# Patient Record
Sex: Male | Born: 1995 | Race: White | Hispanic: No | Marital: Single | Smoking: Never smoker
Health system: Southern US, Community
[De-identification: ages and names within clinical notes are randomized; demographics above are authoritative.]

## PROBLEM LIST (undated history)

## (undated) DIAGNOSIS — F909 Attention-deficit hyperactivity disorder, unspecified type: Secondary | ICD-10-CM

## (undated) DIAGNOSIS — J302 Other seasonal allergic rhinitis: Secondary | ICD-10-CM

## (undated) DIAGNOSIS — F913 Oppositional defiant disorder: Secondary | ICD-10-CM

## (undated) HISTORY — PX: NO PAST SURGERIES: SHX2092

## (undated) HISTORY — DX: Other seasonal allergic rhinitis: J30.2

## (undated) HISTORY — DX: Oppositional defiant disorder: F91.3

## (undated) HISTORY — DX: Attention-deficit hyperactivity disorder, unspecified type: F90.9

---
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
## 2015-01-16 ENCOUNTER — Ambulatory Visit (INDEPENDENT_AMBULATORY_CARE_PROVIDER_SITE_OTHER): Payer: 59 | Admitting: Sports Medicine

## 2015-01-16 ENCOUNTER — Encounter: Payer: Self-pay | Admitting: Sports Medicine

## 2015-01-16 VITALS — BP 121/74 | HR 94 | Ht 71.0 in | Wt 160.0 lb

## 2015-01-16 DIAGNOSIS — M25571 Pain in right ankle and joints of right foot: Secondary | ICD-10-CM

## 2015-01-16 NOTE — Progress Notes (Signed)
  Braysen Cloward - 19 y.o. male MRN [id number]  Date of birth: Aug 31, 1995 Ekin Pilar is a 19 y.o. male who presents today for acute onset swelling and pain in the dorsum of his right foot. His pain started Tuesday night while playing volleyball. He noticed gradual onset of pain that started for several days prior to this. After coming down from a jump the foot was very painful. He had difficulty walking. He had significant swelling that night. The next day the swelling went down some and he can walk with a limp. Today he says the swelling is much less and he can walk with only mild pain. Currently the pain is 1/10. He was playing in [employer] free tennis shoes. These show significant wear and tear on the forefoot  Past history-no significant injuries or surgeries  Review of systems no significant history of joint pain or swelling/ no history of rashes/no history of fever/no history of gout No numbness tingling or sciatic symptoms in the right leg    Exam:  Filed Vitals:   01/16/15 1428  BP: 121/74  Pulse: 94   Gen: NAD Cardiorespiratory - Normal respiratory effort/rate.  RRR  Ankle: No visible erythema or swelling. Range of motion is full in all directions. Strength is 5/5 in all directions. Stable lateral and medial ligaments; squeeze test and kleiger test unremarkable; Talar dome nontender; No pain at base of 5th MT; No tenderness over cuboid; No tenderness over N spot or navicular prominence No tenderness on posterior aspects of lateral and medial malleolus No sign of peroneal tendon subluxations; Negative tarsal tunnel tinel's Able to walk 4 steps.  Foot shape is cavus  Tenderness to palpation is directly over the second tarsometatarsal joint  There is moderate swelling over this joint directly over the area of maximal tenderness  Walking gait is now slightly antalgic  Imaging with ultrasound Small effusion over the second TMT joint There is a minor bony  irregularity of the cuneiform side of the joint Increased Doppler flow over this area

## 2015-01-17 DIAGNOSIS — M25579 Pain in unspecified ankle and joints of unspecified foot: Secondary | ICD-10-CM | POA: Insufficient documentation

## 2015-01-17 NOTE — Assessment & Plan Note (Signed)
We placed him in a postop shoe Arch strap placed No weight bearing activity for 2 weeks except regular walking OK to cross train He needs to purchase shoes with better support  We should repeat his scan in 2 weeks and significantly the amount of weightbearing activity  I am concerned that his history of oppositional defiant disorder may make him noncompliant

## 2015-01-30 ENCOUNTER — Ambulatory Visit (INDEPENDENT_AMBULATORY_CARE_PROVIDER_SITE_OTHER): Payer: 59 | Admitting: Sports Medicine

## 2015-01-30 ENCOUNTER — Encounter: Payer: Self-pay | Admitting: Sports Medicine

## 2015-01-30 VITALS — BP 127/82 | HR 118 | Ht 71.0 in | Wt 160.0 lb

## 2015-01-30 DIAGNOSIS — M25571 Pain in right ankle and joints of right foot: Secondary | ICD-10-CM

## 2015-01-30 NOTE — Assessment & Plan Note (Signed)
This is healing rapidly and clinically he is very functional  Return to normal shoe I added arch support Use arch strap x 6 weeks total  Progress to full activity over next 4 weeks  RTC if inc pain or swelling over injury

## 2015-01-30 NOTE — Progress Notes (Signed)
  Subjective:    Gabriel Moreno is a 19 y.o. old male here with his father for No chief complaint on file. .   CC: Follow up of small avulsion fx at TMT 2 of RT foot  HPI  Gabriel Moreno has complied well with wearing his boot in the interim since last visit. 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 He has also been regularly wearing his compression sleeve and spent 5-7 days icing his foot and continues to elevate his leg frequently. In the interim, he has refrained from high impact activity including running or jumping. He notes that his pain and swelling have improved markedly over the past 2 weeks. He still gets mild swelling usually towards the end of the day but has no pain at night or when he wakes in the morning.  Now no pain with usual walking  Review of Systems no other joint swelling/ no redness/ no arch pain in left foot  History and Problem List: Gabriel Moreno has ADHD (attention deficit hyperactivity disorder), combined type; ODD (oppositional defiant disorder); Dysthymic disorder; and Pain in joint, ankle and foot on his problem list.  [name]  has a past medical history of ADHD (attention deficit hyperactivity disorder); Oppositional defiant disorder; and Seasonal allergies.     Objective:    BP 127/82 mmHg  Pulse 118  Ht 5\' 11"  (1.803 m)  Wt 160 lb (72.576 kg)  BMI 22.33 kg/m2 Physical Exam  Athletic white male in no acute distress  Dark ecchymosis on dorsolateral aspect of foot under compression wrap No noted edema over dorsum of right foot Mild tenderness over dorsum of right 2nd tarsometarsal joint, no distal metatarsal tenderness 5/5 strength on foot dorsiflexion, plantarflexion, inversion, and eversion, toe extension and flexion Normal gait without evidence of antalgia Tolerates light jog without pain Tolerates 3-5 one-foot hops without pain      Assessment and Plan:     Gabriel Moreno  was seen today for follow-up of right proximal stress reaction/fracture at base of 2nd metatarsal. His symptoms have improved markedly with compliance with the boot and compression sleeve.  1. Pain in joint, ankle and foot, right - continue compression sleeve for 4-6 more weeks - arch support provided for new shoes - can ambulate with shoes and discontinue use of the boot - can do walking, light jogging and low impact exercise for next week - if after another week, symptoms remain improved, can start to add in light jumping and advance activity level as pain tolerates - suspect injury will be 90% healed in one more week and 100% healed in 4 more weeks  Charlie Pitter, Annalee Genta, MD

## 2015-02-24 ENCOUNTER — Ambulatory Visit (HOSPITAL_COMMUNITY): Payer: Self-pay | Admitting: Psychiatry

## 2015-04-24 ENCOUNTER — Encounter (HOSPITAL_COMMUNITY): Payer: Self-pay | Admitting: Psychiatry

## 2015-04-24 ENCOUNTER — Ambulatory Visit (INDEPENDENT_AMBULATORY_CARE_PROVIDER_SITE_OTHER): Payer: 59 | Admitting: Psychiatry

## 2015-04-24 VITALS — BP 104/63 | HR 85 | Ht 70.75 in | Wt 158.4 lb

## 2015-04-24 DIAGNOSIS — F341 Dysthymic disorder: Secondary | ICD-10-CM | POA: Diagnosis not present

## 2015-04-24 DIAGNOSIS — F900 Attention-deficit hyperactivity disorder, predominantly inattentive type: Secondary | ICD-10-CM

## 2015-04-24 DIAGNOSIS — F902 Attention-deficit hyperactivity disorder, combined type: Secondary | ICD-10-CM

## 2015-04-24 DIAGNOSIS — F913 Oppositional defiant disorder: Secondary | ICD-10-CM | POA: Diagnosis not present

## 2015-04-24 MED ORDER — AMPHETAMINE-DEXTROAMPHETAMINE 30 MG PO TABS
30.0000 mg | ORAL_TABLET | Freq: Every day | ORAL | Status: DC
Start: 2015-04-24 — End: 2015-11-17

## 2015-04-24 NOTE — Progress Notes (Signed)
    [facility] Follow-up Outpatient Visit  Gabriel Moreno January 30, 1996     Subjective:-  I'm doing well  History of present illness-patient seen for the first time by Dr. Salem Senate, he is a transfer from Dr. Dwyane Dee service  patient is a 20 year old white male with a previous diagnosis of ADHD combined type and ODD presently on Adderall 30 mg every morning seen today for the first time for follow-up. Patient was accompanied by his father and with patient's permission he sat through the session.  Patient states that he is doing well his last semester was very good and he looking forward to starting the semester again. States that his sleep is good, appetite is good mood has been stable and bright. No suicidal or homicidal ideation no hallucinations or delusions.  Patient lives at home with his parents. Overall has been coping well and had a good Christmas. His tolerating his medications well and coping well.     Past Medical History  Diagnosis Date  . ADHD (attention deficit hyperactivity disorder)   . Oppositional defiant disorder   . 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 Patient also works at [employer]: Positive for congestion. Negative for ear discharge, ear pain, hearing loss and tinnitus.   Respiratory: Negative for stridor.   Neurological: Negative for headaches.  Psychiatric/Behavioral: Positive for depression. The patient is nervous/anxious.    Current Outpatient Prescriptions on File Prior to Visit  Medication Sig Dispense Refill  . amphetamine-dextroamphetamine (ADDERALL) 30 MG tablet Take 1 tablet by mouth daily with breakfast. 90 tablet 0  . fluticasone (FLONASE) 50 MCG/ACT nasal spray Place 1 spray into both nostrils daily.    Marland Kitchen  ibuprofen (ADVIL,MOTRIN) 200 MG tablet Take 200 mg by mouth every 6 (six) hours as needed.    . loratadine (CLARITIN) 10 MG tablet Take 10 mg by mouth daily.     No current facility-administered medications on file prior to visit.     General Appearance: alert, oriented, no acute distress and well nourished Blood pressure 104/63, pulse 85, height 5' 10.75" (1.797 m), weight 158 lb 6.4 oz (71.85 kg). Musculoskeletal: Strength & Muscle Tone: within normal limits Gait & Station: normal Patient leans: N/A Mental Status Examination  Appearance: Casually dressed Alert: Yes Attention: fair  Cooperative: Yes Eye Contact: Good Speech: Normal in volume, rate, tone, spontaneous  Psychomotor Activity: Normal Memory/Concentration: So, So, recent and remote memories are intact Oriented: person, place and situation Mood: Euthymic Affect: Congruent and Full Range Thought Processes and Associations: Goal Directed and Intact Fund of Knowledge: Fair Thought Content: Suicidal ideation, Homicidal ideation, Auditory hallucinations, Visual hallucinations, Delusions and Paranoia, none reported Insight: Fair  Judgement: Fair  Language: Fair  suicidal ideation none.  Homicidal ideation none.  Diagnosis: Dysthymic Disorder, ADHD Combined type, Oppositional Defiant Disorder Treatment Plan:  Continue Adderall 30 mrd 1 in the morning. The medication is for ADHD, inattentive type Take melatonin when necessary as needed for sleep.  Call when necessary Follow-up in 6 months This visit was of low medical complexity.. Did discuss communication with family in length with patient at this visit as he struggles in his relationship with his parents  Erin Sons, MD

## 2015-10-22 ENCOUNTER — Ambulatory Visit (HOSPITAL_COMMUNITY): Payer: Self-pay | Admitting: Psychiatry

## 2015-11-17 ENCOUNTER — Ambulatory Visit (INDEPENDENT_AMBULATORY_CARE_PROVIDER_SITE_OTHER): Payer: 59 | Admitting: Sports Medicine

## 2015-11-17 ENCOUNTER — Encounter: Payer: Self-pay | Admitting: Sports Medicine

## 2015-11-17 ENCOUNTER — Ambulatory Visit
Admission: RE | Admit: 2015-11-17 | Discharge: 2015-11-17 | Disposition: A | Discharge status: Home / Self Care | Payer: 59 | Source: Ambulatory Visit | Attending: Sports Medicine | Admitting: Sports Medicine

## 2015-11-17 VITALS — BP 122/64 | HR 63 | Ht 71.0 in | Wt 165.0 lb

## 2015-11-17 DIAGNOSIS — M25532 Pain in left wrist: Secondary | ICD-10-CM | POA: Diagnosis not present

## 2015-11-17 DIAGNOSIS — S6992XA Unspecified injury of left wrist, hand and finger(s), initial encounter: Secondary | ICD-10-CM | POA: Diagnosis not present

## 2015-11-17 MED ORDER — MELOXICAM 15 MG PO TABS: 15.0000 mg | ORAL_TABLET | Freq: Every day | ORAL | 0 refills | Status: AC

## 2015-11-17 MED FILL — MELOXICAM 15 MG TABLET: 15 | 30 days supply | Qty: 30 | Fill #0

## 2015-11-17 NOTE — Progress Notes (Signed)
   Subjective:    Patient ID: Kaeson Eary , male   DOB: [birth year] , 20 y.o..   MRN: [id number]  HPI  Domnique Schettler is here for left wrist pain.  He sustained a left hand/wrist injury ~3 months ago in May. Mechanism of injury: He was playing volleyball and landed on his left hand (hand did not seem to be outstretched). Immediate symptoms: immediate pain, was able to use hand directly after injury. No swelling or bruising was noted. Symptoms have been constant since that time but is more "dull" now. The pain is worse especially when he plays volleyball and with flexion and extension. He continues to play volleyball about 5 x a week . Prior history of related problems: no prior problems with this area in the past. Denies numbness or tingling.    Review of Systems: Per HPI. All other systems reviewed and are negative.  Past Medical History: Patient Active Problem List   Diagnosis Date Noted  . Pain in joint, ankle and foot 01/17/2015  . ADHD (attention deficit hyperactivity disorder), combined type 01/25/2012  . ODD (oppositional defiant disorder) 01/25/2012  . Dysthymic disorder 01/25/2012    Medications: Current Outpatient Prescriptions  Medication Sig Dispense Refill  . fluticasone (FLONASE) 50 MCG/ACT nasal spray Place 1 spray into both nostrils daily.    Marland Kitchen ibuprofen (ADVIL,MOTRIN) 200 MG tablet Take 200 mg by mouth every 6 (six) hours as needed.    . loratadine (CLARITIN) 10 MG tablet Take 10 mg by mouth daily.    . meloxicam (MOBIC) 15 MG tablet Take 1 tablet (15 mg total) by mouth daily. 30 tablet 0   No current facility-administered medications for this visit.       Objective:   BP 122/64   Pulse 63   Ht 5\' 11"  (1.803 m)   Wt 165 lb (74.8 kg)   BMI 23.01 kg/m  Physical Exam  Gen: NAD, alert, cooperative with exam, well-appearing Hand exam:  Left hand/wrist: No swelling or ecchymosis. Tenderness to palpation of middle volar wrist but no specific bony  tenderness appreciated. Pain with wrist flexion and extension but normal strength. Ligaments grossly intact, FROM all joints, scaphoid (snuffbox) tenderness absent, radial pulse normal, sensation normal.  Right hand/wrist: normal hand/wrist exam, no swelling, tenderness, instability. Ligaments intact, FROM all joints. Neurovascularly intact.    Assessment & Plan:   ASSESSMENT: Left wrist pain: Differentials include wrist fracture (although less likely based on exam) vs ligament sprain/tear vs tendon strain  PLAN: -Obtain X-Ray with multiple views: A/P, lateral, scaphoid, carpal tunnel - Mobic 15 mg qd x 7 days - Immobilize wrist for 3 weeks; wrist brace  Follow up: 3 weeks  Patient seen and evaluated with the resident. X-rays, including a carpal tunnel view, show no obvious fracture. If patient's symptoms persist a follow-up consider further diagnostic imaging.

## 2015-12-08 ENCOUNTER — Ambulatory Visit: Payer: 59 | Admitting: Sports Medicine

## 2017-01-07 ENCOUNTER — Ambulatory Visit (INDEPENDENT_AMBULATORY_CARE_PROVIDER_SITE_OTHER): Payer: 59 | Admitting: Physician Assistant

## 2017-01-07 ENCOUNTER — Encounter: Payer: Self-pay | Admitting: Emergency Medicine

## 2017-01-07 ENCOUNTER — Encounter: Payer: Self-pay | Admitting: Physician Assistant

## 2017-01-07 VITALS — BP 120/62 | HR 71 | Temp 98.6°F | Resp 14 | Ht 72.0 in | Wt 167.0 lb

## 2017-01-07 DIAGNOSIS — F900 Attention-deficit hyperactivity disorder, predominantly inattentive type: Secondary | ICD-10-CM | POA: Insufficient documentation

## 2017-01-07 DIAGNOSIS — F902 Attention-deficit hyperactivity disorder, combined type: Secondary | ICD-10-CM

## 2017-01-07 MED ORDER — AMPHETAMINE-DEXTROAMPHET ER 30 MG PO CP24: 30.0000 mg | ORAL_CAPSULE | ORAL | 0 refills | Status: DC

## 2017-01-07 NOTE — Progress Notes (Signed)
Pre visit review using our clinic review tool, if applicable. No additional management support is needed unless otherwise documented below in the visit note. 

## 2017-01-07 NOTE — Patient Instructions (Signed)
Please keep active and eat a well-balanced diet. Start the Adderall as directed. If you have any issue getting from the pharmacy, let me know so I can complete a prior authorization.  Follow-up with me in 1 month. We can do you physical at that time.  Welcome to the practice!

## 2017-01-07 NOTE — Assessment & Plan Note (Signed)
Long-standing history. Prior evaluations in chart. CSC reviewed and signed by patient. Rx for Adderall XR 30 mg printed. Will have him restart. Follow-up 1 month for reassessment. Will do CPE at that time.

## 2017-01-07 NOTE — Progress Notes (Signed)
   Patient presents to clinic today to establish care.  Chronic Issues: ADHD -- long-standing history of ADHD. Diagnosed at around age 21. Most recently on Adderall XR 30 mg. Endorses good focus with medication previously. Denies sleep issues with medication but did note anorexia with this regimen. 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 Is finding he is struggling in his classes as they are more challenging. Focus is a significant issue. Would like to discuss restarting a medication.   Health Maintenance: Immunizations -- Defers flu shot today. Defers tetanus today. Will get at CPE in 1 month.  Past Medical History:  Diagnosis Date  . ADHD (attention deficit hyperactivity disorder)   . Oppositional defiant disorder   . Seasonal allergies     Past Surgical History:  Procedure Laterality Date  . NO PAST SURGERIES      Current Outpatient Prescriptions on File Prior to Visit  Medication Sig Dispense Refill  . loratadine (CLARITIN) 10 MG tablet Take 10 mg by mouth daily.     No current facility-administered medications on file prior to visit.     No Known Allergies  Family History  Problem Relation Age of Onset  . Obesity Mother   . Osteoarthritis Father   . Cancer Maternal Grandmother        Patient thinks it was Breast Cancer.    Social History   Social History  . Marital status: Single    Spouse name: N/A  . Number of children: N/A  . Years of education: N/A   Occupational History  . Not on file.   Social History Main Topics  . Smoking status: Never Smoker  . Smokeless tobacco: Never Used  . Alcohol use 0.0 oz/week     Comment: maybe twice monthly  . Drug use: No  . Sexual activity: No   Other Topics Concern  . Not on file   Social History Narrative  . No narrative on file   Review of Systems  Constitutional: Negative for fever and malaise/fatigue.  Eyes: Negative for blurred vision and double vision.  Respiratory: Negative for  cough.   Cardiovascular: Negative for chest pain and palpitations.  Genitourinary: Negative for dysuria.  Musculoskeletal: Negative for myalgias.  Neurological: Negative for dizziness.  Psychiatric/Behavioral: Negative for depression.   BP 120/62   Pulse 71   Temp 98.6 F (37 C) (Oral)   Resp 14   Ht 6' (1.829 m)   Wt 167 lb (75.8 kg)   SpO2 99%   BMI 22.65 kg/m   Physical Exam  Constitutional: He is oriented to person, place, and time and well-developed, well-nourished, and in no distress.  HENT:  Head: Normocephalic and atraumatic.  Eyes: Conjunctivae are normal.  Neck: Neck supple.  Cardiovascular: Normal rate, regular rhythm, normal heart sounds and intact distal pulses.   Pulmonary/Chest: Effort normal and breath sounds normal. No respiratory distress. He has no wheezes. He has no rales. He exhibits no tenderness.  Neurological: He is alert and oriented to person, place, and time.  Skin: Skin is warm and dry. No rash noted.  Psychiatric: Affect normal.  Vitals reviewed.  Assessment/Plan: ADHD (attention deficit hyperactivity disorder), combined type Long-standing history. Prior evaluations in chart. CSC reviewed and signed by patient. Rx for Adderall XR 30 mg printed. Will have him restart. Follow-up 1 month for reassessment. Will do CPE at that time.     Leeanne Rio, PA-C

## 2017-02-04 ENCOUNTER — Ambulatory Visit (INDEPENDENT_AMBULATORY_CARE_PROVIDER_SITE_OTHER): Payer: 59 | Admitting: Physician Assistant

## 2017-02-04 ENCOUNTER — Encounter: Payer: Self-pay | Admitting: Physician Assistant

## 2017-02-04 DIAGNOSIS — F902 Attention-deficit hyperactivity disorder, combined type: Secondary | ICD-10-CM

## 2017-02-04 DIAGNOSIS — Z23 Encounter for immunization: Secondary | ICD-10-CM | POA: Diagnosis not present

## 2017-02-04 MED ORDER — AMPHETAMINE-DEXTROAMPHET ER 30 MG PO CP24: 30.0000 mg | ORAL_CAPSULE | ORAL | 0 refills | Status: DC

## 2017-02-04 NOTE — Progress Notes (Signed)
   Patient presents to clinic today for follow-up of ADHD. Is currently on Adderall XR 30 mg daily. Is taking as directed. Tolerating very well without side effect. Is eating and hydrating well. CSC on file at last visit.   Past Medical History:  Diagnosis Date  . ADHD (attention deficit hyperactivity disorder)   . Oppositional defiant disorder   . Seasonal allergies     Current Outpatient Prescriptions on File Prior to Visit  Medication Sig Dispense Refill  . Multiple Vitamins-Minerals (MULTIVITAMIN ADULTS PO) Take 1 tablet by mouth daily.     No current facility-administered medications on file prior to visit.    No Known Allergies  Family History  Problem Relation Age of Onset  . Obesity Mother   . Osteoarthritis Father   . Cancer Maternal Grandmother        Patient thinks it was Breast Cancer.    Social History   Social History  . Marital status: Single    Spouse name: N/A  . Number of children: N/A  . Years of education: N/A   Social History Main Topics  . Smoking status: Never Smoker  . Smokeless tobacco: Never Used  . Alcohol use 0.0 oz/week     Comment: maybe twice monthly  . Drug use: No  . Sexual activity: No   Other Topics Concern  . None   Social History Narrative  . None   Review of Systems - See HPI.  All other ROS are negative.  BP 108/60   Pulse 75   Temp 98.4 F (36.9 C) (Oral)   Resp 14   Ht 6' (1.829 m)   Wt 167 lb (75.8 kg)   SpO2 98%   BMI 22.65 kg/m   Physical Exam  Constitutional: He is well-developed, well-nourished, and in no distress.  HENT:  Head: Normocephalic and atraumatic.  Eyes: Conjunctivae are normal.  Neck: Neck supple.  Cardiovascular: Normal rate, regular rhythm, normal heart sounds and intact distal pulses.   Pulmonary/Chest: Effort normal and breath sounds normal. No respiratory distress. He has no wheezes. He has no rales. He exhibits no tenderness.  Skin: Skin is warm and dry. No rash noted.  Psychiatric:  Affect normal.  Vitals reviewed.  Assessment/Plan: ADHD (attention deficit hyperactivity disorder), combined type Doing well. Vitals stable. Continue current regimen. Refills given. Follow-up 6 months.     Leeanne Rio, PA-C

## 2017-02-04 NOTE — Assessment & Plan Note (Signed)
Doing well. Vitals stable. Continue current regimen. Refills given. Follow-up 6 months.

## 2017-02-04 NOTE — Progress Notes (Signed)
Pre visit review using our clinic review tool, if applicable. No additional management support is needed unless otherwise documented below in the visit note. 

## 2017-02-04 NOTE — Patient Instructions (Signed)
Please continue medications as directed. I am glad you are doing well!  Follow-up with me in 6 months for a complete physical. Return sooner if needed.

## 2017-02-14 MED FILL — ADDERALL XR 30 MG CAP SA: 30 | 30 days supply | Qty: 30 | Fill #0

## 2017-03-07 ENCOUNTER — Ambulatory Visit (HOSPITAL_COMMUNITY): Payer: 59 | Admitting: Psychiatry

## 2017-03-28 MED FILL — ADDERALL XR 30 MG CAP SA: 30 | 30 days supply | Qty: 30 | Fill #0

## 2017-04-28 MED FILL — ADDERALL XR 30 MG CAP SA: 30 | 30 days supply | Qty: 30 | Fill #0

## 2017-07-17 ENCOUNTER — Encounter: Payer: Self-pay | Admitting: Family

## 2017-07-17 ENCOUNTER — Ambulatory Visit: Payer: Self-pay | Admitting: Family

## 2017-07-17 VITALS — BP 118/60 | HR 82 | Temp 98.6°F | Wt 173.0 lb

## 2017-07-17 DIAGNOSIS — J302 Other seasonal allergic rhinitis: Secondary | ICD-10-CM

## 2017-07-17 MED ORDER — LEVOCETIRIZINE DIHYDROCHLORIDE 5 MG PO TABS: 5.0000 mg | ORAL_TABLET | Freq: Every evening | ORAL | 1 refills | Status: DC

## 2017-07-17 MED ORDER — PROMETHAZINE-PHENYLEPHRINE 6.25-5 MG/5ML PO SYRP: 5.0000 mL | ORAL_SOLUTION | Freq: Three times a day (TID) | ORAL | 0 refills | Status: DC | PRN

## 2017-07-17 NOTE — Progress Notes (Signed)
Subjective:     Patient ID: Gabriel Moreno, male   DOB: [birth year], 22 y.o.   MRN: [id number]  HPI 22 year old male is in today with c/o cough that is worse at night, nasal congestion, sneezing x 1 week. No fever or chills. Taking cough medication that is not helping. Has a history of Seasonal allergies  Review of Systems  Constitutional: Negative.   HENT: Positive for congestion, postnasal drip, rhinorrhea and sneezing. Negative for sinus pressure, sinus pain and sore throat.   Eyes: Negative.   Respiratory: Positive for cough. Negative for shortness of breath and stridor.   Cardiovascular: Negative.   Endocrine: Negative.   Musculoskeletal: Negative.   Allergic/Immunologic: Positive for environmental allergies.  Neurological: Negative.   Psychiatric/Behavioral: Negative.    Past Medical History:  Diagnosis Date  . ADHD (attention deficit hyperactivity disorder)   . Oppositional defiant disorder   . Seasonal allergies     Social History   Socioeconomic History  . Marital status: Single    Spouse name: Not on file  . Number of children: Not on file  . Years of education: Not on file  . Highest education level: Not on file  Occupational History  . Not on file  Social Needs  . Financial resource strain: Not on file  . Food insecurity:    Worry: Not on file    Inability: Not on file  . Transportation needs:    Medical: Not on file    Non-medical: Not on file  Tobacco Use  . Smoking status: Never Smoker  . Smokeless tobacco: Never Used  Substance and Sexual Activity  . Alcohol use: Yes    Alcohol/week: 0.0 oz    Comment: maybe twice monthly  . Drug use: No  . Sexual activity: Never    Partners: Female  Lifestyle  . Physical activity:    Days per week: Not on file    Minutes per session: Not on file  . Stress: Not on file  Relationships  . Social connections:    Talks on phone: Not on file    Gets together: Not on file    Attends religious service: Not  on file    Active member of club or organization: Not on file    Attends meetings of clubs or organizations: Not on file    Relationship status: Not on file  . Intimate partner violence:    Fear of current or ex partner: Not on file    Emotionally abused: Not on file    Physically abused: Not on file    Forced sexual activity: Not on file  Other Topics Concern  . Not on file  Social History Narrative  . Not on file    Past Surgical History:  Procedure Laterality Date  . NO PAST SURGERIES      Family History  Problem Relation Age of Onset  . Obesity Mother   . Osteoarthritis Father   . Cancer Maternal Grandmother        Patient thinks it was Breast Cancer.    No Known Allergies  Current Outpatient Medications on File Prior to Visit  Medication Sig Dispense Refill  . ibuprofen (ADVIL,MOTRIN) 100 MG/5ML suspension Take 200 mg by mouth every 4 (four) hours as needed.    . loratadine (CLARITIN) 10 MG tablet Take 10 mg by mouth daily.    . Multiple Vitamins-Minerals (MULTIVITAMIN ADULTS PO) Take 1 tablet by mouth daily.    Marland Kitchen amphetamine-dextroamphetamine (ADDERALL XR) 30  MG 24 hr capsule Take 1 capsule (30 mg total) by mouth every morning. (Patient not taking: Reported on 07/17/2017) 30 capsule 0   No current facility-administered medications on file prior to visit.     BP 118/60   Pulse 82   Temp 98.6 F (37 C)   Wt 173 lb (78.5 kg)   SpO2 96%   BMI 23.46 kg/m chart    Objective:   Physical Exam  Constitutional: He is oriented to person, place, and time. He appears well-developed and well-nourished.  HENT:  Right Ear: External ear normal.  Left Ear: External ear normal.  Mouth/Throat: Oropharynx is clear and moist.  Nasal turbinates inflammed  Neck: Normal range of motion. Neck supple.  Cardiovascular: Normal rate, regular rhythm and normal heart sounds.  Pulmonary/Chest: Effort normal and breath sounds normal. He has no wheezes.  Musculoskeletal: Normal range of  motion.  Neurological: He is alert and oriented to person, place, and time.  Skin: Skin is warm and dry.  Psychiatric: He has a normal mood and affect.       Assessment:     Sheila was seen today for save-cough.  Diagnoses and all orders for this visit:  Seasonal allergies  Other orders -     levocetirizine (XYZAL) 5 MG tablet; Take 1 tablet (5 mg total) by mouth every evening. -     promethazine-phenylephrine (PROMETHAZINE-PHENYLEPHRINE) 6.25-5 MG/5ML SYRP; Take 5 mLs by mouth every 8 (eight) hours as needed for congestion.      Plan:     Call the office with any questions or concerns

## 2017-07-17 NOTE — Patient Instructions (Signed)

## 2017-07-28 ENCOUNTER — Telehealth: Payer: Self-pay

## 2018-01-23 IMAGING — CR DG WRIST COMPLETE 3+V*L*
4 series · 4 of 4 positions shown · non-contrast
Comparison: None.

CLINICAL DATA: Patient c/o left anterior wrist pain, worse on ulnar
side x a few weeks after injuring wrist during volleyball.

EXAM:
LEFT WRIST - COMPLETE 3+ VIEW

[x wrist pa left]
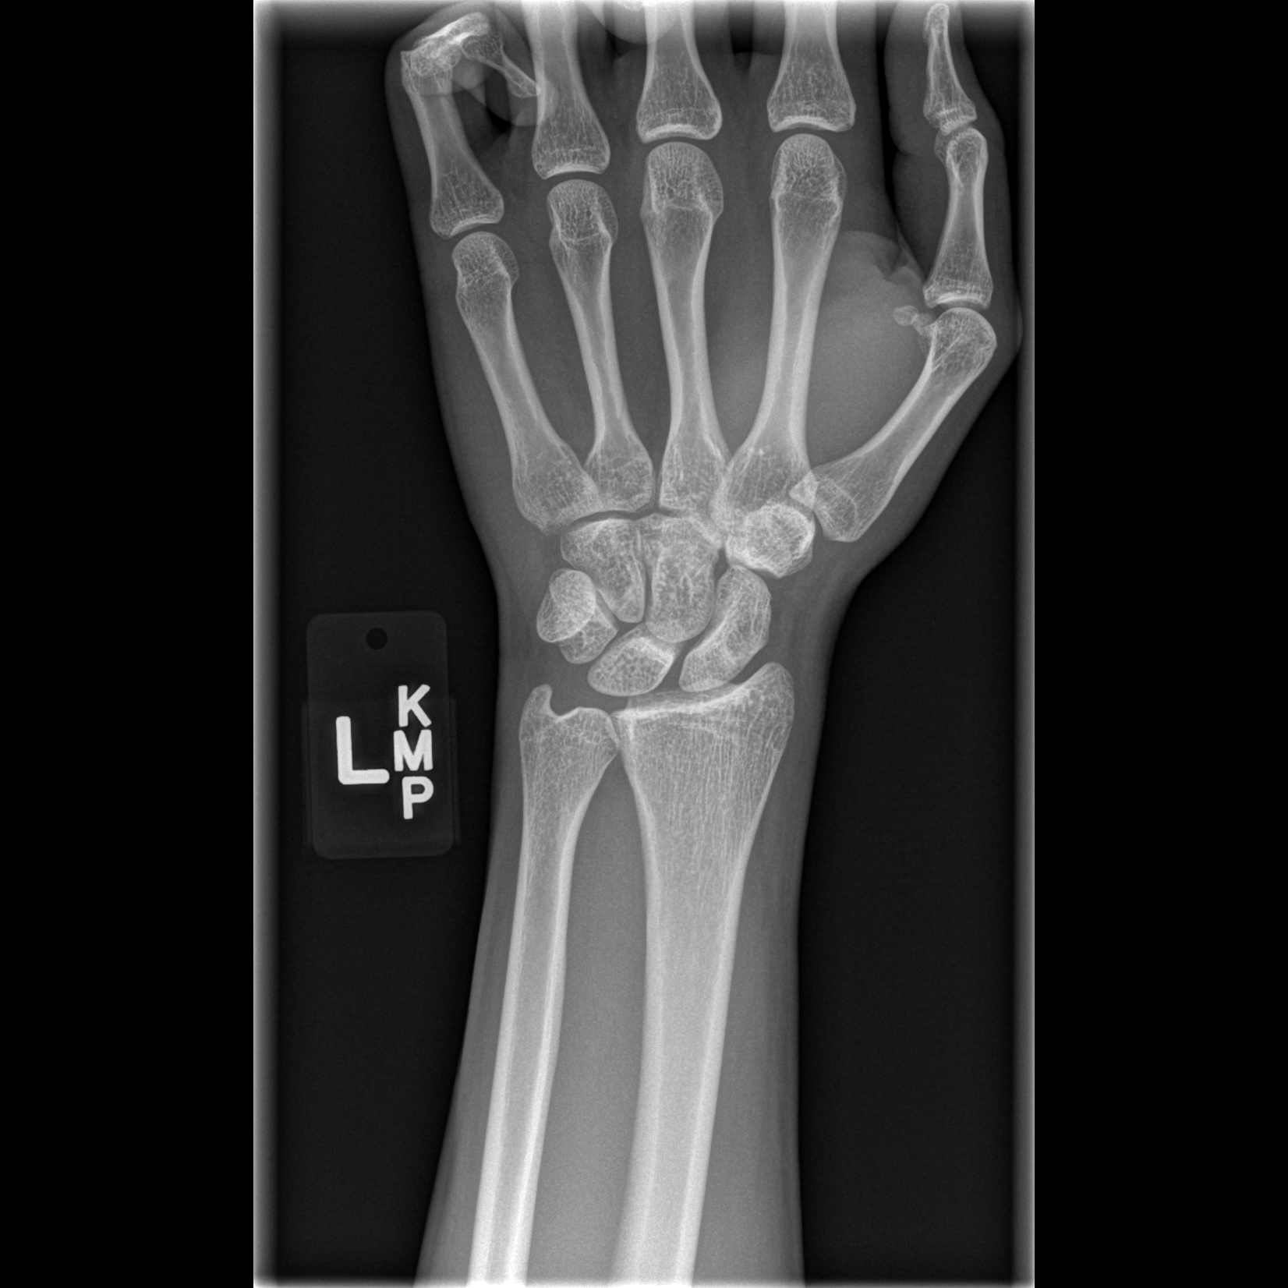

[x wrist obl left]
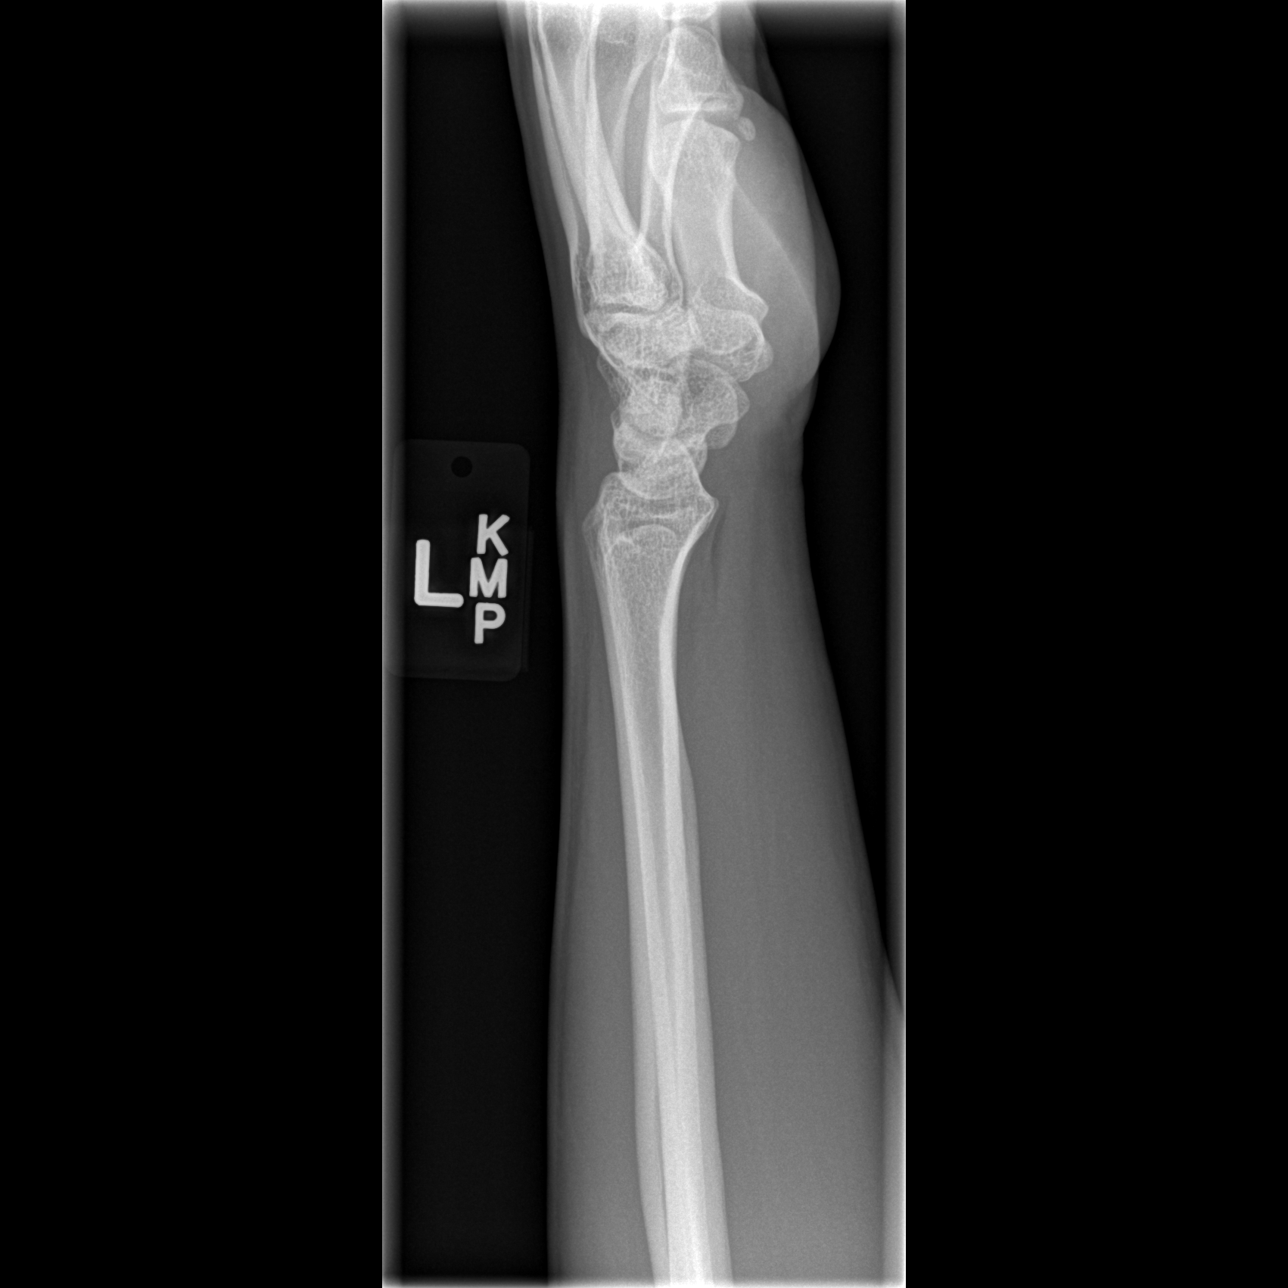

[x wrist lat left (1 of 2)]
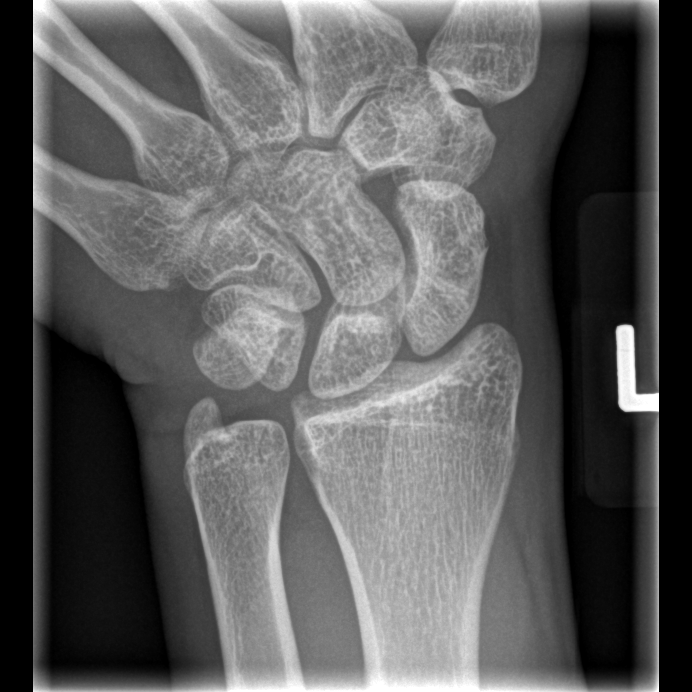

[x wrist lat left (2 of 2)]
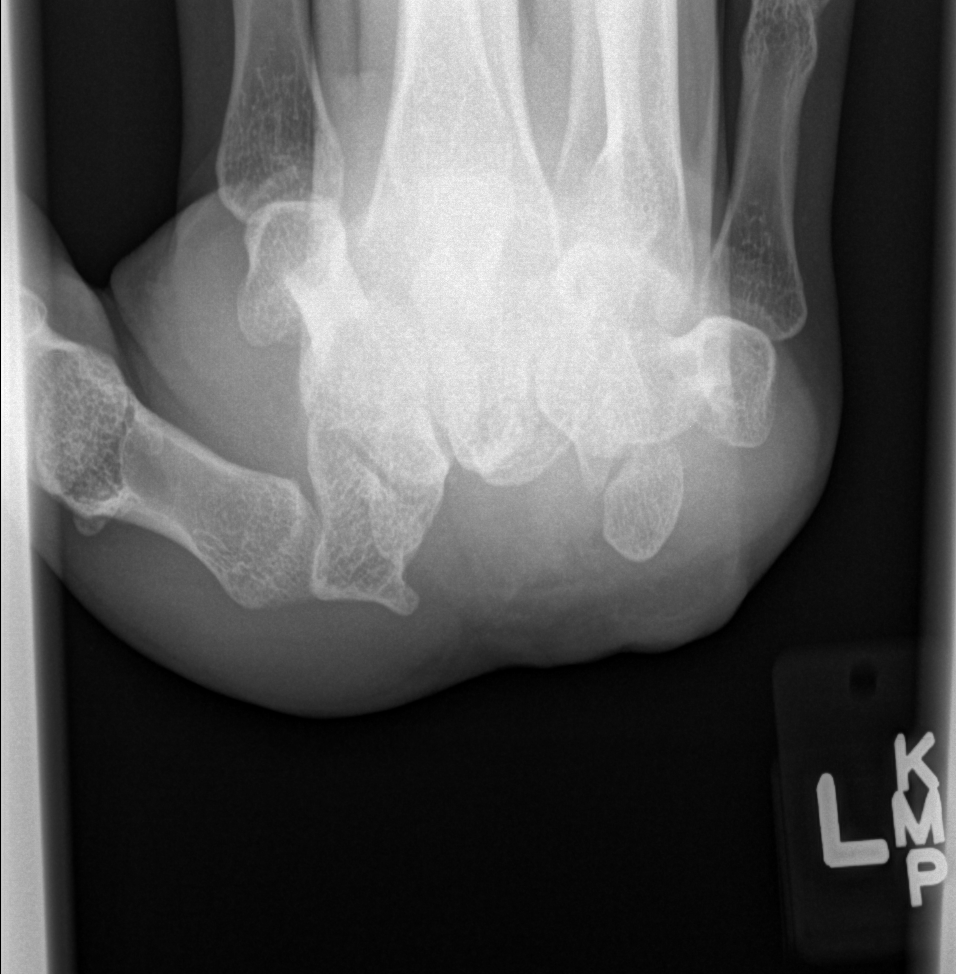

[4 of 4 positions shown; findings below may reference images not displayed]

FINDINGS: There is no evidence of fracture or dislocation. There is no
evidence of arthropathy or other focal bone abnormality. Soft
tissues are unremarkable.
IMPRESSION: Negative.

## 2018-02-23 ENCOUNTER — Encounter: Payer: Self-pay | Admitting: Sports Medicine

## 2018-02-23 ENCOUNTER — Ambulatory Visit (INDEPENDENT_AMBULATORY_CARE_PROVIDER_SITE_OTHER): Payer: 59 | Admitting: Sports Medicine

## 2018-02-23 VITALS — BP 118/72 | Ht 70.0 in | Wt 170.0 lb

## 2018-02-23 DIAGNOSIS — M25561 Pain in right knee: Secondary | ICD-10-CM | POA: Diagnosis not present

## 2018-02-23 DIAGNOSIS — G8929 Other chronic pain: Secondary | ICD-10-CM | POA: Diagnosis not present

## 2018-02-23 MED ORDER — NITROGLYCERIN 0.2 MG/HR TD PT24: MEDICATED_PATCH | TRANSDERMAL | 1 refills | Status: AC

## 2018-02-23 MED FILL — NITROGLYCERIN 0.2 MG/HR PTC: 0.2 | 28 days supply | Qty: 7 | Fill #0

## 2018-02-23 NOTE — Patient Instructions (Addendum)
Your knee pain is due to a partial tear of the patellar tendon. We will treat with nitroglycerin.  Follow instructions below. We will have you follow-up in 6 weeks.  In the meantime, avoid any jumping or volleyball.   Nitroglycerin Protocol   Apply 1/4 nitroglycerin patch to affected area daily.  Change position of patch within the affected area every 24 hours.  You may experience a headache during the first 1-2 weeks of using the patch, these should subside.  If you experience headaches after beginning nitroglycerin patch treatment, you may take your preferred over the counter pain reliever.  Another side effect of the nitroglycerin patch is skin irritation or rash related to patch adhesive.  Please notify our office if you develop more severe headaches or rash, and stop the patch.  Tendon healing with nitroglycerin patch may require 12 to 24 weeks depending on the extent of injury.  Men should not use if taking Viagra, Cialis, or Levitra.   Do not use if you have migraines or rosacea.  Follow- up in 6 weeks

## 2018-02-23 NOTE — Progress Notes (Signed)
  Gabriel Moreno - 22 y.o. male MRN [id number]  Date of birth: Aug 24, 1995    SUBJECTIVE:      Chief Complaint:/ HPI:  22 year old male with 1 year of right knee pain.  Patient states the pain began while he was playing volleyball.  He believes he landed from a jump and suddenly felt pain in his anterior knee.  Denies any pop or giving way.  He denies any associated swelling at that time.  For the past year is been relatively constant.  Is worse with going downstairs, jumping, and range of motion extension and extreme flexion at the knee.Marland Kitchen  He usually took 1 month off but had no improvement in when he returned to volleyball.  He uses Advil prior to activity which does help a bit.  He also has tried a knee sleeve which helps minimally.  He denies noticing any swelling at any time.  No associated erythema or bruising.  No numbness or tingling.  No skin changes   ROS:     See HPI  PERTINENT  PMH / PSH FH / / SH:  Past Medical, Surgical, Social, and Family History Reviewed & Updated in the EMR.    OBJECTIVE: BP 118/72   Ht 5\' 10"  (1.778 m)   Wt 170 lb (77.1 kg)   BMI 24.39 kg/m   Physical Exam:  Vital signs are reviewed.  GEN: Alert and oriented, NAD Pulm: Breathing unlabored PSY: normal mood, congruent affect  MSK: Right knee: - Inspection: no gross deformity. No swelling/effusion, erythema or bruising. Skin intact - Palpation: Tenderness over the patellar tendon just distal to the patella - ROM: full active ROM with flexion and extension in knee and hip - Strength: 5/5 strength - Neuro/vasc: NV intact - Special Tests: - LIGAMENTS: negative anterior and posterior drawer, negative Lachman's, no MCL or LCL laxity  -- MENISCUS: negative McMurray's -- PF JOINT: nml patellar mobility without apprehension  MSK [place]: Ultrasound of the right knee reveals a focal area of swelling in tearing of the deep fibers of the patella tendon just distal to the inferior pole of the patella.  At  this area, the tendon measures 0.76 mm.  Increased neovascularity in this area.  Left knee: No obvious deformity or swelling No tenderness palpation Full range of motion 5/5 strength N/V intact    ASSESSMENT & PLAN:  1.  Anterior right knee pain secondary to partial-thickness tear of the patellar tendon. - Nitroglycerin patches -Physical therapy - No volleyball or jumping -Follow-up 6 weeks   Patient seen and evaluated with the sports medicine fellow.  I agree with the above plan of care.  Ultrasound of the right knee shows hypoechoic changes to the deep fibers of the patellar tendon.  This is seen on both long and short axis views.  Findings are consistent with partial tearing of the patellar tendon in this area.  Treatment as above and follow-up in 6 weeks for repeat ultrasound.

## 2018-03-03 DIAGNOSIS — M25461 Effusion, right knee: Secondary | ICD-10-CM | POA: Diagnosis not present

## 2018-03-03 DIAGNOSIS — M62551 Muscle wasting and atrophy, not elsewhere classified, right thigh: Secondary | ICD-10-CM | POA: Diagnosis not present

## 2018-03-03 DIAGNOSIS — M25561 Pain in right knee: Secondary | ICD-10-CM | POA: Diagnosis not present

## 2018-03-03 DIAGNOSIS — M25661 Stiffness of right knee, not elsewhere classified: Secondary | ICD-10-CM | POA: Diagnosis not present

## 2018-03-13 DIAGNOSIS — M25661 Stiffness of right knee, not elsewhere classified: Secondary | ICD-10-CM | POA: Diagnosis not present

## 2018-03-13 DIAGNOSIS — M25561 Pain in right knee: Secondary | ICD-10-CM | POA: Diagnosis not present

## 2018-03-13 DIAGNOSIS — M25461 Effusion, right knee: Secondary | ICD-10-CM | POA: Diagnosis not present

## 2018-03-13 DIAGNOSIS — M62551 Muscle wasting and atrophy, not elsewhere classified, right thigh: Secondary | ICD-10-CM | POA: Diagnosis not present

## 2018-03-22 MED FILL — NITROGLYCERIN 0.2 MG/HR PTC: 0.2 | 28 days supply | Qty: 7 | Fill #1

## 2018-04-10 ENCOUNTER — Ambulatory Visit: Payer: 59 | Admitting: Sports Medicine

## 2018-05-10 DIAGNOSIS — H52223 Regular astigmatism, bilateral: Secondary | ICD-10-CM | POA: Diagnosis not present

## 2018-06-12 DIAGNOSIS — D2262 Melanocytic nevi of left upper limb, including shoulder: Secondary | ICD-10-CM | POA: Diagnosis not present

## 2018-06-12 DIAGNOSIS — L2089 Other atopic dermatitis: Secondary | ICD-10-CM | POA: Diagnosis not present

## 2018-06-12 DIAGNOSIS — D2261 Melanocytic nevi of right upper limb, including shoulder: Secondary | ICD-10-CM | POA: Diagnosis not present

## 2018-06-12 DIAGNOSIS — D225 Melanocytic nevi of trunk: Secondary | ICD-10-CM | POA: Diagnosis not present

## 2018-06-12 DIAGNOSIS — B078 Other viral warts: Secondary | ICD-10-CM | POA: Diagnosis not present

## 2018-06-12 DIAGNOSIS — D224 Melanocytic nevi of scalp and neck: Secondary | ICD-10-CM | POA: Diagnosis not present

## 2019-01-18 MED FILL — FLUARIX QUADRIVALENT 0.5 ML: 0.5 | 1 days supply | Qty: 1 | Fill #0

## 2019-02-08 DIAGNOSIS — Z20828 Contact with and (suspected) exposure to other viral communicable diseases: Secondary | ICD-10-CM | POA: Diagnosis not present

## 2019-05-06 ENCOUNTER — Ambulatory Visit: Payer: 59 | Attending: Internal Medicine

## 2019-05-06 DIAGNOSIS — Z23 Encounter for immunization: Secondary | ICD-10-CM | POA: Insufficient documentation

## 2019-05-06 NOTE — Progress Notes (Signed)
   Covid-19 Vaccination Clinic  Name:  Gabriel Moreno    MRN: [id number] DOB: 17-Mar-1996  05/06/2019  Gabriel Moreno was observed post Covid-19 immunization for 15 minutes without incidence. He was provided with Vaccine Information Sheet and instruction to access the V-Safe system.   Gabriel Moreno was instructed to call 911 with any severe reactions post vaccine: Marland Kitchen Difficulty breathing  . Swelling of your face and throat  . A fast heartbeat  . A bad rash all over your body  . Dizziness and weakness    Immunizations Administered    Name Date Dose VIS Date Route   Pfizer COVID-19 Vaccine 05/06/2019  3:44 PM 0.3 mL 03/23/2019 Intramuscular   Manufacturer: [place]   Lot: [id number]   [place]: [id number]

## 2019-05-22 NOTE — Telephone Encounter (Signed)
Error

## 2019-05-28 ENCOUNTER — Ambulatory Visit: Payer: 59 | Attending: Internal Medicine

## 2019-05-28 DIAGNOSIS — Z23 Encounter for immunization: Secondary | ICD-10-CM | POA: Insufficient documentation

## 2019-05-28 NOTE — Progress Notes (Signed)
   Covid-19 Vaccination Clinic  Name:  Constantino Anne    MRN: [id number] DOB: July 20, 1995  05/28/2019  Mr. Bish was observed post Covid-19 immunization for 15 minutes without incidence. He was provided with Vaccine Information Sheet and instruction to access the V-Safe system.   Mr. Dunman was instructed to call 911 with any severe reactions post vaccine: Marland Kitchen Difficulty breathing  . Swelling of your face and throat  . A fast heartbeat  . A bad rash all over your body  . Dizziness and weakness    Immunizations Administered    Name Date Dose VIS Date Route   Pfizer COVID-19 Vaccine 05/28/2019  1:13 PM 0.3 mL 03/23/2019 Intramuscular   Manufacturer: [place]   Lot: [id number]   [place]: [id number]
